# Patient Record
Sex: Male | Born: 1980 | Race: Black or African American | Hispanic: No | Marital: Single | Smoking: Former smoker
Health system: Southern US, Community
[De-identification: ages and names within clinical notes are randomized; demographics above are authoritative.]

## PROBLEM LIST (undated history)

## (undated) DIAGNOSIS — R011 Cardiac murmur, unspecified: Secondary | ICD-10-CM

## (undated) HISTORY — PX: NO PAST SURGERIES: SHX2092

---

## 2007-06-02 ENCOUNTER — Emergency Department: Payer: Self-pay | Admitting: Emergency Medicine

## 2010-01-08 ENCOUNTER — Emergency Department: Payer: Self-pay | Admitting: Emergency Medicine

## 2010-09-20 ENCOUNTER — Emergency Department: Payer: Self-pay | Admitting: Emergency Medicine
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
## 2012-09-19 ENCOUNTER — Emergency Department: Payer: Self-pay | Admitting: Emergency Medicine

## 2012-09-19 LAB — COMPREHENSIVE METABOLIC PANEL
Alkaline Phosphatase: 83 U/L (ref 50–136)
Anion Gap: 5 — ABNORMAL LOW (ref 7–16)
Bilirubin,Total: 0.8 mg/dL (ref 0.2–1.0)
Chloride: 106 mmol/L (ref 98–107)
Co2: 28 mmol/L (ref 21–32)
EGFR (Non-African Amer.): >60
Glucose: 143 mg/dL — ABNORMAL HIGH (ref 65–99)
SGPT (ALT): 26 U/L (ref 12–78)
Sodium: 139 mmol/L (ref 136–145)
Total Protein: 8.3 g/dL — ABNORMAL HIGH (ref 6.4–8.2)

## 2012-09-19 LAB — URINALYSIS, COMPLETE
Bilirubin,UR: NEGATIVE
Nitrite: NEGATIVE
Ph: 6 (ref 4.5–8.0)
Protein: NEGATIVE
WBC UR: 1 /HPF (ref 0–5)

## 2012-09-19 LAB — CBC
HGB: 14.8 g/dL (ref 13.0–18.0)
MCH: 32 pg (ref 26.0–34.0)
MCHC: 34.1 g/dL (ref 32.0–36.0)
MCV: 94 fL (ref 80–100)
WBC: 6 10*3/uL (ref 3.8–10.6)

## 2015-04-03 ENCOUNTER — Encounter: Payer: Self-pay | Admitting: *Deleted

## 2015-04-03 ENCOUNTER — Emergency Department
Admission: EM | Admit: 2015-04-03 | Discharge: 2015-04-03 | Disposition: A | Discharge status: Home / Self Care | Payer: Self-pay | Attending: Emergency Medicine | Admitting: Emergency Medicine

## 2015-04-03 DIAGNOSIS — K047 Periapical abscess without sinus: Secondary | ICD-10-CM | POA: Insufficient documentation

## 2015-04-03 DIAGNOSIS — F17200 Nicotine dependence, unspecified, uncomplicated: Secondary | ICD-10-CM | POA: Insufficient documentation

## 2015-04-03 HISTORY — DX: Cardiac murmur, unspecified: R01.1

## 2015-04-03 MED ORDER — NAPROXEN 500 MG PO TABS: 500.0000 mg | ORAL_TABLET | Freq: Two times a day (BID) | ORAL | Status: AC

## 2015-04-03 MED ORDER — AMOXICILLIN 500 MG PO TABS: 500.0000 mg | ORAL_TABLET | Freq: Three times a day (TID) | ORAL | Status: DC

## 2015-04-03 NOTE — Discharge Instructions (Signed)

## 2015-04-03 NOTE — ED Notes (Signed)
Pt states right sided dental pain and swelling, denies any broken tooth or problems on that side

## 2015-04-03 NOTE — ED Provider Notes (Signed)
[facility] Emergency Department Provider Note ____________________________________________  Time seen: Approximately 3:53 PM  I have reviewed the triage vital signs and the nursing notes.   HISTORY  Chief Complaint Dental Pain   HPI Jared Phillips is a 35 y.o. male who presents to the emergency department for evaluation of dental pain. He states that he noticed some swelling on the right lower jaw last week, but it went away. Pain and swelling started again today. No relief with orajel.    Past Medical History  Diagnosis Date  . Heart murmur     There are no active problems to display for this patient.   History reviewed. No pertinent past surgical history.  Current Outpatient Rx  Name  Route  Sig  Dispense  Refill  . amoxicillin (AMOXIL) 500 MG tablet   Oral   Take 1 tablet (500 mg total) by mouth 3 (three) times daily.   30 tablet   0   . naproxen (NAPROSYN) 500 MG tablet   Oral   Take 1 tablet (500 mg total) by mouth 2 (two) times daily with a meal.   60 tablet   0     Allergies Review of patient's allergies indicates no known allergies.  History reviewed. No pertinent family history.  Social History Social History  Substance Use Topics  . Smoking status: Current Every Day Smoker  . Smokeless tobacco: None  . Alcohol Use: Yes    Review of Systems Constitutional: No fever/chills Eyes: No visual changes. ENT: No sore throat. Cardiovascular: Denies chest pain. Respiratory: Denies shortness of breath. Gastrointestinal: No abdominal pain.  No nausea, no vomiting.  Genitourinary: Negative for dysuria. Musculoskeletal: Negative for back pain. Skin: Negative for rash. Neurological: Negative for headaches, focal weakness or numbness. 10-point ROS otherwise negative.  ____________________________________________   PHYSICAL EXAM:  VITAL SIGNS: ED Triage Vitals  Enc Vitals Group     BP 04/03/15 1530 125/83 mmHg     Pulse  Rate 04/03/15 1530 68     Resp 04/03/15 1530 18     Temp 04/03/15 1530 98.7 F (37.1 C)     Temp Source 04/03/15 1530 Oral     SpO2 04/03/15 1530 97 %     Weight 04/03/15 1530 130 lb (58.968 kg)     Height 04/03/15 1530 5\' 3"  (1.6 m)     Head Cir --      Peak Flow --      Pain Score 04/03/15 1531 8     Pain Loc --      Pain Edu? --      Excl. in GC? --     Constitutional: Alert and oriented. Well appearing and in no acute distress. Eyes: Conjunctivae are normal. PERRL. EOMI. Head: Atraumatic. Nose: No congestion/rhinnorhea. Mouth/Throat: Mucous membranes are moist.  Oropharynx non-erythematous. Periodontal Exam    Neck: No stridor.  Hematological/Lymphatic/Immunilogical: No cervical lymphadenopathy. Cardiovascular:   Good peripheral circulation. Respiratory: Normal respiratory effort.  No retractions. Musculoskeletal: No lower extremity tenderness nor edema.  No joint effusions. Neurologic:  Normal speech and language. No gross focal neurologic deficits are appreciated. Speech is normal. No gait instability. Skin:  Skin is warm, dry and intact. No rash noted. Psychiatric: Mood and affect are normal. Speech and behavior are normal.  ____________________________________________   LABS (all labs ordered are listed, but only abnormal results are displayed)  Labs Reviewed - No data to display ____________________________________________   RADIOLOGY   ____________________________________________   PROCEDURES  Procedure(s) performed: None  Critical Care performed: No  ____________________________________________   INITIAL IMPRESSION / ASSESSMENT AND PLAN / ED COURSE  Pertinent labs & imaging results that were available during my care of the patient were reviewed by me and considered in my medical decision making (see chart for details).  Patient was advised to see the dentist within 14 days. Also advised to take the antibiotic until finished. Instructed to  return to the ER for symptoms that change or worsen if you are unable to schedule an appointment. ____________________________________________   FINAL CLINICAL IMPRESSION(S) / ED DIAGNOSES  Final diagnoses:  Dental abscess       Chinita Pester, FNP 04/03/15 1617  Minna Antis, MD 04/03/15 2235

## 2018-09-17 ENCOUNTER — Other Ambulatory Visit (HOSPITAL_COMMUNITY): Payer: Self-pay | Admitting: Family Medicine

## 2018-09-22 LAB — GONOCOCCUS CULTURE

## 2018-10-08 ENCOUNTER — Other Ambulatory Visit: Payer: Self-pay

## 2018-10-08 ENCOUNTER — Encounter: Payer: Self-pay | Admitting: Physician Assistant

## 2018-10-08 ENCOUNTER — Ambulatory Visit: Payer: Self-pay | Admitting: Physician Assistant

## 2018-10-08 ENCOUNTER — Encounter: Payer: Self-pay | Admitting: Nurse Practitioner

## 2018-10-08 DIAGNOSIS — Z113 Encounter for screening for infections with a predominantly sexual mode of transmission: Secondary | ICD-10-CM

## 2018-10-08 LAB — GRAM STAIN

## 2018-10-08 NOTE — Progress Notes (Signed)
    STI clinic/screening visit  Subjective:  Jared Phillips is a 38 y.o. male being seen today for an STI screening visit. The patient reports they do not have symptoms.  Patient has the following medical conditionsdoes not have a problem list on file.  Chief Complaint  Patient presents with  . SEXUALLY TRANSMITTED DISEASE    Patient reports that he would like a recheck.  Reports that he had some vomiting the day after he was here and treated.  Denies any current s/s, change in any history since his visit on 09/17/2018.  The only change in history is that he had a couple of shots 3 days ago. HPI   See flowsheet for further details and programmatic requirements.    The following portions of the patient's history were reviewed and updated as appropriate: allergies, current medications, past family history, past medical history, past social history, past surgical history and problem list. Problem list updated.  Objective:  There were no vitals filed for this visit.  Physical Exam Constitutional:      General: He is not in acute distress.    Appearance: Normal appearance.  HENT:     Head: Normocephalic and atraumatic.     Mouth/Throat:     Mouth: Mucous membranes are moist.     Pharynx: Oropharynx is clear. No oropharyngeal exudate or posterior oropharyngeal erythema.  Neck:     Musculoskeletal: Neck supple.  Pulmonary:     Effort: Pulmonary effort is normal.  Abdominal:     Palpations: Abdomen is soft. There is no mass.     Tenderness: There is no abdominal tenderness. There is no guarding or rebound.     Hernia: No hernia is present.  Genitourinary:    Penis: Normal.      Scrotum/Testes: Normal.     Comments: Pubic area without edema, erythema, nits, lice or lesions Penis without edema, erythema, lesions or urethral discharge No inguinal adenopathy  Lymphadenopathy:     Cervical: No cervical adenopathy.  Skin:    General: Skin is warm and dry.     Findings: No  bruising, erythema, lesion or rash.  Neurological:     Mental Status: He is alert and oriented to person, place, and time.  Psychiatric:        Mood and Affect: Mood normal.        Behavior: Behavior normal.       Assessment and Plan:  Jared Phillips is a 38 y.o. male presenting to the [facility] Department for STI screening  1. Screening for STD (sexually transmitted disease) Patient into clinic for rescreenig to make sure that the NGU seen on slide at last visit has cleared Rec condoms with all sex RTC prn - Gonococcus culture - Gram stain     No follow-ups on file.  No future appointments.  Jerene Dilling, PA

## 2018-10-13 LAB — GONOCOCCUS CULTURE

## 2019-03-05 ENCOUNTER — Ambulatory Visit: Payer: Self-pay | Admitting: Physician Assistant

## 2019-03-05 ENCOUNTER — Other Ambulatory Visit: Payer: Self-pay

## 2019-03-05 DIAGNOSIS — Z113 Encounter for screening for infections with a predominantly sexual mode of transmission: Secondary | ICD-10-CM

## 2019-03-05 LAB — GRAM STAIN

## 2019-03-05 NOTE — Progress Notes (Signed)
Gram Stain results reviewed. Per standing orders no treatment indicated. Condoms given. Hal Morales, RN

## 2019-03-06 ENCOUNTER — Encounter: Payer: Self-pay | Admitting: Physician Assistant

## 2019-03-06 NOTE — Progress Notes (Signed)
    STI clinic/screening visit  Subjective:  Jared Phillips is a 38 y.o. male being seen today for an STI screening visit. The patient reports they do not have symptoms.   Patient has the following medical conditions:  There are no active problems to display for this patient.    Chief Complaint  Patient presents with  . SEXUALLY TRANSMITTED DISEASE    HPI  Patient reports that he is not having any symptoms but would like a screening.  Declines blood work today.  Denies chronic conditions and history of surgery.  See flowsheet for further details and programmatic requirements.    The following portions of the patient's history were reviewed and updated as appropriate: allergies, current medications, past medical history, past social history, past surgical history and problem list.  Objective:  There were no vitals filed for this visit.  Physical Exam Constitutional:      General: He is not in acute distress.    Appearance: Normal appearance. He is normal weight.  HENT:     Head: Normocephalic and atraumatic.     Comments: No nits, lice, or hair loss. No cervical, supraclavicular or axillary adenopathy.    Mouth/Throat:     Mouth: Mucous membranes are moist.     Pharynx: Oropharynx is clear. No oropharyngeal exudate or posterior oropharyngeal erythema.  Eyes:     Conjunctiva/sclera: Conjunctivae normal.  Neck:     Musculoskeletal: Neck supple. No muscular tenderness.  Genitourinary:    Penis: Normal.      Scrotum/Testes: Normal.     Comments: Pubic area without nits, lice, edema, erythema, lesions and inguinal adenopathy. Penis circumcised and without discharge at meatus. Skin:    General: Skin is warm and dry.     Findings: No bruising, erythema, lesion or rash.  Neurological:     Mental Status: He is alert and oriented to person, place, and time.  Psychiatric:        Mood and Affect: Mood normal.        Behavior: Behavior normal.        Thought Content:  Thought content normal.        Judgment: Judgment normal.       Assessment and Plan:  Jared Phillips is a 38 y.o. male presenting to the [facility] Department for STI screening  1. Screening for STD (sexually transmitted disease) Patient into clinic without symptoms.  Patient declines blood work today. Rec condoms with all sex. Await test results.  Counseled that RN will call if needs to RTC for treatment once results are back. - Gram stain - Gonococcus culture     No follow-ups on file.  No future appointments.  Jerene Dilling, PA

## 2019-03-10 LAB — GONOCOCCUS CULTURE

## 2019-04-24 ENCOUNTER — Ambulatory Visit: Payer: Self-pay

## 2019-05-02 ENCOUNTER — Ambulatory Visit: Payer: Self-pay | Admitting: Physician Assistant

## 2019-05-02 ENCOUNTER — Encounter: Payer: Self-pay | Admitting: Physician Assistant

## 2019-05-02 ENCOUNTER — Other Ambulatory Visit: Payer: Self-pay

## 2019-05-02 DIAGNOSIS — Z113 Encounter for screening for infections with a predominantly sexual mode of transmission: Secondary | ICD-10-CM

## 2019-05-02 LAB — GRAM STAIN

## 2019-05-02 NOTE — Progress Notes (Signed)
   [facility] Department STI clinic/screening visit  Subjective:  Jared Phillips is a 39 y.o. male being seen today for an STI screening visit. The patient reports they do not have symptoms.    Patient has the following medical conditions:  There are no problems to display for this patient.    Chief Complaint  Patient presents with  . SEXUALLY TRANSMITTED DISEASE    HPI  Patient reports that he does not have any symptoms but would like a screening today.  Declines blood work today.   See flowsheet for further details and programmatic requirements.    The following portions of the patient's history were reviewed and updated as appropriate: allergies, current medications, past medical history, past social history, past surgical history and problem list.  Objective:  There were no vitals filed for this visit.  Physical Exam Constitutional:      General: He is not in acute distress.    Appearance: Normal appearance. He is normal weight.  HENT:     Head: Normocephalic and atraumatic.     Comments: No nits, lice, or hair loss. No cervical, supraclavicular or axillary adenopathy.    Mouth/Throat:     Mouth: Mucous membranes are moist.     Pharynx: Oropharynx is clear. No oropharyngeal exudate or posterior oropharyngeal erythema.  Eyes:     Conjunctiva/sclera: Conjunctivae normal.  Pulmonary:     Effort: Pulmonary effort is normal.  Abdominal:     Palpations: Abdomen is soft. There is no mass.     Tenderness: There is no abdominal tenderness. There is no guarding or rebound.  Genitourinary:    Penis: Normal.      Comments: Pubic area without nits, lice, edema, erythema, lesions and inguinal adenopathy. Penis circumcised and without lesions, rash and discharge at meatus. Musculoskeletal:     Cervical back: Neck supple. No tenderness.  Skin:    General: Skin is warm and dry.     Findings: No bruising, erythema, lesion or rash.  Neurological:     Mental  Status: He is alert and oriented to person, place, and time.  Psychiatric:        Mood and Affect: Mood normal.        Behavior: Behavior normal.        Thought Content: Thought content normal.        Judgment: Judgment normal.       Assessment and Plan:  Jared Phillips is a 39 y.o. male presenting to the [facility] Department for STI screening  1. Screening for STD (sexually transmitted disease) Patient into clinic without symptoms.  Patient declines blood work today. Rec condoms with all sex. Await test results.  Counseled that RN will call if needs to RTC for treatment once results are back. - Gram stain - Gonococcus culture     No follow-ups on file.  No future appointments.  Matt Holmes, PA

## 2019-05-06 LAB — GONOCOCCUS CULTURE

## 2019-05-28 ENCOUNTER — Ambulatory Visit: Payer: Self-pay

## 2019-05-30 ENCOUNTER — Other Ambulatory Visit: Payer: Self-pay

## 2019-05-30 ENCOUNTER — Ambulatory Visit
Admission: EM | Admit: 2019-05-30 | Discharge: 2019-05-30 | Disposition: A | Discharge status: Home / Self Care | Payer: Self-pay | Attending: Emergency Medicine | Admitting: Emergency Medicine

## 2019-05-30 DIAGNOSIS — Z202 Contact with and (suspected) exposure to infections with a predominantly sexual mode of transmission: Secondary | ICD-10-CM | POA: Insufficient documentation

## 2019-05-30 NOTE — ED Triage Notes (Signed)
Patient states that he is here for STD testing, no known exposure.

## 2019-05-30 NOTE — Discharge Instructions (Addendum)
Your STD tests are pending.  If your test results are positive, we will call you.  You may need treatment and your partner(s) may also need treatment.    Not have sexual activity until your test results are back.

## 2019-05-30 NOTE — ED Provider Notes (Signed)
[name]    CSN: [id number] Arrival date & time: 05/30/19  1146      History   Chief Complaint Chief Complaint  Patient presents with  . STD Testing    HPI Jared Phillips is a 39 y.o. male.   Patient presents with request for STD testing.  He denies symptoms.  He denies lesions, rash, penile discharge, testicular pain, dysuria, abdominal pain, or other symptoms.  Dates he has a history of gonorrhea.  The history is provided by the patient.    Past Medical History:  Diagnosis Date  . Heart murmur     There are no problems to display for this patient.   Past Surgical History:  Procedure Laterality Date  . NO PAST SURGERIES         Home Medications    Prior to Admission medications   Not on File    Family History Family History  Problem Relation Age of Onset  . Healthy Father     Social History Social History   Tobacco Use  . Smoking status: Current Every Day Smoker    Types: Cigarettes  . Smokeless tobacco: Never Used  Substance Use Topics  . Alcohol use: Yes    Comment: occasinally has 1-3 shots of liquor  . Drug use: Not Currently     Allergies   Patient has no known allergies.   Review of Systems Review of Systems  Constitutional: Negative for chills and fever.  HENT: Negative for ear pain and sore throat.   Eyes: Negative for pain and visual disturbance.  Respiratory: Negative for cough and shortness of breath.   Cardiovascular: Negative for chest pain and palpitations.  Gastrointestinal: Negative for abdominal pain and vomiting.  Genitourinary: Negative for discharge, dysuria, hematuria and testicular pain.  Musculoskeletal: Negative for arthralgias and back pain.  Skin: Negative for color change, rash and wound.  Neurological: Negative for seizures and syncope.  All other systems reviewed and are negative.    Physical Exam Triage Vital Signs ED Triage Vitals  Enc Vitals Group     BP 05/30/19 1154 118/72   Pulse Rate 05/30/19 1154 67     Resp 05/30/19 1154 16     Temp 05/30/19 1154 98.4 F (36.9 C)     Temp Source 05/30/19 1154 Oral     SpO2 05/30/19 1154 98 %     Weight 05/30/19 1152 120 lb (54.4 kg)     Height 05/30/19 1152 5\' 3"  (1.6 m)     Head Circumference --      Peak Flow --      Pain Score 05/30/19 1152 0     Pain Loc --      Pain Edu? --      Excl. in GC? --    No data found.  Updated Vital Signs BP 118/72 (BP Location: Right Arm)   Pulse 67   Temp 98.4 F (36.9 C) (Oral)   Resp 16   Ht 5\' 3"  (1.6 m)   Wt 120 lb (54.4 kg)   SpO2 98%   BMI 21.26 kg/m   Visual Acuity Right Eye Distance:   Left Eye Distance:   Bilateral Distance:    Right Eye Near:   Left Eye Near:    Bilateral Near:     Physical Exam Vitals and nursing note reviewed.  Constitutional:      General: He is not in acute distress.    Appearance: He is well-developed.  HENT:  Head: Normocephalic and atraumatic.     Mouth/Throat:     Mouth: Mucous membranes are moist.     Pharynx: Oropharynx is clear.  Eyes:     Conjunctiva/sclera: Conjunctivae normal.  Cardiovascular:     Rate and Rhythm: Normal rate and regular rhythm.     Heart sounds: No murmur.  Pulmonary:     Effort: Pulmonary effort is normal. No respiratory distress.     Breath sounds: Normal breath sounds.  Abdominal:     General: Bowel sounds are normal.     Palpations: Abdomen is soft.     Tenderness: There is no abdominal tenderness. There is no right CVA tenderness, left CVA tenderness, guarding or rebound.  Genitourinary:    Penis: Normal.      Testes: Normal.  Musculoskeletal:     Cervical back: Neck supple.  Skin:    General: Skin is warm and dry.     Findings: No rash.  Neurological:     General: No focal deficit present.     Mental Status: He is alert and oriented to person, place, and time.  Psychiatric:        Mood and Affect: Mood normal.        Behavior: Behavior normal.      UC Treatments / Results   Labs (all labs ordered are listed, but only abnormal results are displayed) Labs Reviewed  CYTOLOGY, (ORAL, ANAL, URETHRAL) ANCILLARY ONLY    EKG   Radiology No results found.  Procedures Procedures (including critical care time)  Medications Ordered in UC Medications - No data to display  Initial Impression / Assessment and Plan / UC Course  I have reviewed the triage vital signs and the nursing notes.  Pertinent labs & imaging results that were available during my care of the patient were reviewed by me and considered in my medical decision making (see chart for details).    Possible exposure to STD.  Urethral swab obtained.  Instructed patient to abstain from sexual activity until his test results are back.  Discussed that he and his sexual partners may require treatment if his test results are positive.  Patient agrees to plan of care.     Final Clinical Impressions(s) / UC Diagnoses   Final diagnoses:  Possible exposure to STD     Discharge Instructions     Your STD tests are pending.  If your test results are positive, we will call you.  You may need treatment and your partner(s) may also need treatment.    Not have sexual activity until your test results are back.     ED Prescriptions    None     PDMP not reviewed this encounter.   Sharion Balloon, NP 05/30/19 [id number]

## 2019-05-31 LAB — CYTOLOGY, (ORAL, ANAL, URETHRAL) ANCILLARY ONLY
Chlamydia: NEGATIVE
Neisseria Gonorrhea: NEGATIVE
Trichomonas: NEGATIVE

## 2019-06-04 ENCOUNTER — Ambulatory Visit: Payer: Self-pay

## 2019-06-28 ENCOUNTER — Emergency Department: Payer: Self-pay

## 2019-06-28 ENCOUNTER — Emergency Department
Admission: EM | Admit: 2019-06-28 | Discharge: 2019-06-28 | Disposition: A | Discharge status: Home / Self Care | Payer: Self-pay | Attending: Emergency Medicine | Admitting: Emergency Medicine

## 2019-06-28 ENCOUNTER — Encounter: Payer: Self-pay | Admitting: Emergency Medicine

## 2019-06-28 ENCOUNTER — Other Ambulatory Visit: Payer: Self-pay

## 2019-06-28 DIAGNOSIS — W500XXA Accidental hit or strike by another person, initial encounter: Secondary | ICD-10-CM | POA: Insufficient documentation

## 2019-06-28 DIAGNOSIS — Y999 Unspecified external cause status: Secondary | ICD-10-CM | POA: Insufficient documentation

## 2019-06-28 DIAGNOSIS — F17210 Nicotine dependence, cigarettes, uncomplicated: Secondary | ICD-10-CM | POA: Insufficient documentation

## 2019-06-28 DIAGNOSIS — S4991XA Unspecified injury of right shoulder and upper arm, initial encounter: Secondary | ICD-10-CM

## 2019-06-28 DIAGNOSIS — Y929 Unspecified place or not applicable: Secondary | ICD-10-CM | POA: Insufficient documentation

## 2019-06-28 DIAGNOSIS — S0081XA Abrasion of other part of head, initial encounter: Secondary | ICD-10-CM

## 2019-06-28 DIAGNOSIS — Y939 Activity, unspecified: Secondary | ICD-10-CM | POA: Insufficient documentation

## 2019-06-28 MED ORDER — BACITRACIN-NEOMYCIN-POLYMYXIN 400-5-5000 EX OINT
TOPICAL_OINTMENT | Freq: Once | CUTANEOUS | Status: DC
  Filled 2019-06-28: qty 1

## 2019-06-28 NOTE — ED Provider Notes (Signed)
[facility] Emergency Department Provider Note  ____________________________________________  Time seen: Approximately 10:04 AM  I have reviewed the triage vital signs and the nursing notes.   HISTORY  Chief Complaint Fall    HPI Jared Phillips is a 39 y.o. male that presents to the emergency department for evaluation of right shoulder pain, diffuse back pain, bilateral knee pain after police arrest.  Patient states that the police slammed him to the ground.  He needs medical clearance to go to jail.  He has some abrasions to his face, which is minimally sore.  Most pain is to his shoulder, which he hears popping with movement.  He is walking without difficulty. Areas are "sore."  He does not feel that anything is broken.  He did not lose consciousness.  No shortness of breath, chest pain, abdominal pain.  Past Medical History:  Diagnosis Date  . Heart murmur     There are no problems to display for this patient.   Past Surgical History:  Procedure Laterality Date  . NO PAST SURGERIES      Prior to Admission medications   Not on File    Allergies Patient has no known allergies.  Family History  Problem Relation Age of Onset  . Healthy Father     Social History Social History   Tobacco Use  . Smoking status: Current Every Day Smoker    Types: Cigarettes  . Smokeless tobacco: Never Used  Substance Use Topics  . Alcohol use: Yes    Comment: occasinally has 1-3 shots of liquor  . Drug use: Not on file     Review of Systems  Cardiovascular: No chest pain. Respiratory: No SOB. Gastrointestinal: No abdominal pain.  No nausea, no vomiting.  Musculoskeletal: Positive for shoulder pain.  Negative for neck pain. Skin: Negative for rash, abrasions, lacerations, ecchymosis. Neurological: Negative for headaches   ____________________________________________   PHYSICAL EXAM:  VITAL SIGNS: ED Triage Vitals  Enc Vitals Group     BP  06/28/19 0901 131/80     Pulse Rate 06/28/19 0901 74     Resp 06/28/19 0901 16     Temp 06/28/19 0901 98.2 F (36.8 C)     Temp Source 06/28/19 0901 Oral     SpO2 06/28/19 0901 100 %     Weight 06/28/19 0856 115 lb (52.2 kg)     Height 06/28/19 0856 5\' 3"  (1.6 m)     Head Circumference --      Peak Flow --      Pain Score 06/28/19 0856 6     Pain Loc --      Pain Edu? --      Excl. in GC? --      Constitutional: Alert and oriented. Well appearing and in no acute distress. Eyes: Conjunctivae are normal. PERRL. EOMI. Head: Abrasions to bilateral cheeks without surrounding tenderness to palpation. ENT:      Ears:      Nose: No congestion/rhinnorhea.      Mouth/Throat: Mucous membranes are moist.  Neck: No stridor.  No cervical spine tenderness to palpation. Cardiovascular: Normal rate, regular rhythm.  Good peripheral circulation. Respiratory: Normal respiratory effort without tachypnea or retractions. Lungs CTAB. Good air entry to the bases with no decreased or absent breath sounds. Gastrointestinal: Soft and nontender to palpation. No guarding or rigidity. No palpable masses. No distention.  Musculoskeletal: Full range of motion to all extremities. No gross deformities appreciated.  Full range of motion  of right shoulder.  Tenderness to palpation to anterior and posterior shoulder.  Strength equal in upper extremities bilaterally.  Ambulatory without assistance.  Normal gait. Neurologic:  Normal speech and language. No gross focal neurologic deficits are appreciated.  Skin:  Skin is warm, dry and intact. No rash noted. Psychiatric: Mood and affect are normal. Speech and behavior are normal. Patient exhibits appropriate insight and judgement.   ____________________________________________   LABS (all labs ordered are listed, but only abnormal results are displayed)  Labs Reviewed - No data to  display ____________________________________________  EKG   ____________________________________________  RADIOLOGY Robinette Haines, personally viewed and evaluated these images (plain radiographs) as part of my medical decision making, as well as reviewing the written report by the radiologist.  DG Shoulder Right  Result Date: 06/28/2019 CLINICAL DATA:  Posttraumatic right shoulder pain EXAM: RIGHT SHOULDER - 2+ VIEW COMPARISON:  None. FINDINGS: There is no evidence of fracture or dislocation. There is no evidence of arthropathy or other focal bone abnormality. Soft tissues are unremarkable. IMPRESSION: Negative. Electronically Signed   By: Monte Fantasia M.D.   On: 06/28/2019 10:31    ____________________________________________    PROCEDURES  Procedure(s) performed:    Procedures    Medications  neomycin-bacitracin-polymyxin (NEOSPORIN) ointment packet (has no administration in time range)     ____________________________________________   INITIAL IMPRESSION / ASSESSMENT AND PLAN / ED COURSE  Pertinent labs & imaging results that were available during my care of the patient were reviewed by me and considered in my medical decision making (see chart for details).  Review of the [place] CSRS was performed in accordance of the [place] prior to dispensing any controlled drugs.   Patient presented to the emergency department for evaluation after injury this morning.  Shoulder x-ray is negative for acute bony abnormalities.  Patient is to follow up with primary care as directed. Patient is given ED precautions to return to the ED for any worsening or new symptoms.   Jared Phillips Phillips was evaluated in Emergency Department on 06/28/2019 for the symptoms described in the history of present illness. He was evaluated in the context of the global COVID-19 pandemic, which necessitated consideration that the patient might be at risk for infection with the SARS-CoV-2 virus that causes  COVID-19. Institutional protocols and algorithms that pertain to the evaluation of patients at risk for COVID-19 are in a state of rapid change based on information released by regulatory bodies including the CDC and federal and state organizations. These policies and algorithms were followed during the patient's care in the ED.  ____________________________________________  FINAL CLINICAL IMPRESSION(S) / ED DIAGNOSES  Final diagnoses:  Injury of right shoulder, initial encounter  Abrasion of face, initial encounter      NEW MEDICATIONS STARTED DURING THIS VISIT:  ED Discharge Orders    None          This chart was dictated using voice recognition software/Dragon. Despite best efforts to proofread, errors can occur which can change the meaning. Any change was purely unintentional.    Laban Emperor, PA-C 06/28/19 1152    Lavonia Drafts, MD 06/28/19 1314

## 2019-06-28 NOTE — ED Triage Notes (Addendum)
Pt here for right shoulder pain, back pain, and bilateral knee pain.  Pt states "I didn't willingly fall, they slammed me on the ground".  Ambulatory without difficulty to check in.  Here to be medically cleared so can go to jail.  Also c/o pain to face.

## 2019-06-28 NOTE — ED Notes (Signed)
See triage note   Presents with police  States he is having right shoulder and bilateral knee pain  Also having some lower back pain  Ambulates well to treatment room

## 2019-08-12 ENCOUNTER — Other Ambulatory Visit: Payer: Self-pay

## 2019-08-12 ENCOUNTER — Ambulatory Visit: Payer: Self-pay | Admitting: Physician Assistant

## 2019-08-12 DIAGNOSIS — Z113 Encounter for screening for infections with a predominantly sexual mode of transmission: Secondary | ICD-10-CM

## 2019-08-12 LAB — GRAM STAIN

## 2019-08-12 NOTE — Progress Notes (Signed)
Gram stain reviewed, no tx per provider orders. Provider orders completed. 

## 2019-08-13 ENCOUNTER — Encounter: Payer: Self-pay | Admitting: Physician Assistant

## 2019-08-13 NOTE — Progress Notes (Signed)
   [facility] Department STI clinic/screening visit  Subjective:  Jared Phillips is a 39 y.o. male being seen today for an STI screening visit. The patient reports they do not have symptoms.    Patient has the following medical conditions:  There are no problems to display for this patient.    Chief Complaint  Patient presents with  . SEXUALLY TRANSMITTED DISEASE    STD screening except bloodwork    HPI  Patient reports that he does not have any symptoms but would like a screening today.  Denies chronic conditions, regular medications and history of surgery.  Declines blood work today.    See flowsheet for further details and programmatic requirements.    The following portions of the patient's history were reviewed and updated as appropriate: allergies, current medications, past medical history, past social history, past surgical history and problem list.  Objective:  There were no vitals filed for this visit.  Physical Exam Constitutional:      General: He is not in acute distress.    Appearance: Normal appearance.  HENT:     Head: Normocephalic and atraumatic.     Comments: No nits, lice, or hair loss. No cervical, supraclavicular or axillary adenopathy.    Mouth/Throat:     Mouth: Mucous membranes are moist.     Pharynx: Oropharynx is clear. No oropharyngeal exudate or posterior oropharyngeal erythema.  Eyes:     Conjunctiva/sclera: Conjunctivae normal.  Pulmonary:     Effort: Pulmonary effort is normal.  Abdominal:     Palpations: Abdomen is soft. There is no mass.     Tenderness: There is no abdominal tenderness. There is no guarding or rebound.  Genitourinary:    Penis: Normal.      Testes: Normal.     Comments: Pubic area without nits, lice, edema, erythema, lesions and inguinal adenopathy. Penis circumcised, without rash, lesions and discharge at meatus. Musculoskeletal:     Cervical back: Neck supple. No tenderness.  Skin:    General:  Skin is warm and dry.     Findings: No bruising, erythema, lesion or rash.  Neurological:     Mental Status: He is alert and oriented to person, place, and time.  Psychiatric:        Mood and Affect: Mood normal.        Behavior: Behavior normal.        Thought Content: Thought content normal.        Judgment: Judgment normal.       Assessment and Plan:  Jared Phillips is a 39 y.o. male presenting to the [facility] Department for STI screening  1. Screening for STD (sexually transmitted disease) Patient into clinic without symptoms.  Declines blood work today.  Rec condoms with all sex. Await test results.  Counseled that RN will call if needs to RTC for treatment once results are back. - Gram stain - Gonococcus culture - Gonococcus culture     No follow-ups on file.  No future appointments.  Matt Holmes, PA

## 2019-08-16 LAB — GONOCOCCUS CULTURE

## 2019-10-17 ENCOUNTER — Other Ambulatory Visit: Payer: Self-pay

## 2019-10-17 ENCOUNTER — Ambulatory Visit: Payer: Self-pay | Admitting: Physician Assistant

## 2019-10-17 DIAGNOSIS — Z113 Encounter for screening for infections with a predominantly sexual mode of transmission: Secondary | ICD-10-CM

## 2019-10-17 LAB — GRAM STAIN

## 2019-10-17 NOTE — Progress Notes (Signed)
Gram stain reviewed and is negative today, so no treatment needed for gram stain per standing order. Provider orders completed. 

## 2019-10-18 ENCOUNTER — Encounter: Payer: Self-pay | Admitting: Physician Assistant

## 2019-10-18 NOTE — Progress Notes (Signed)
   [facility] Department STI clinic/screening visit  Subjective:  Jared Phillips is a 39 y.o. male being seen today for an STI screening visit. The patient reports they do not have symptoms.    Patient has the following medical conditions:  There are no problems to display for this patient.    Chief Complaint  Patient presents with  . SEXUALLY TRANSMITTED DISEASE    screening    HPI  Patient reports that he is not having any symptoms but would like a screening today.  Denies chronic conditions, surgeries and regular medicines.  States last HIV testing was 3 months ago.    See flowsheet for further details and programmatic requirements.    The following portions of the patient's history were reviewed and updated as appropriate: allergies, current medications, past medical history, past social history, past surgical history and problem list.  Objective:  There were no vitals filed for this visit.  Physical Exam Constitutional:      General: He is not in acute distress.    Appearance: Normal appearance.  HENT:     Head: Normocephalic and atraumatic.     Comments: No nits, lice or hair loss. No cervical, supraclavicular or axillary adenopathy.    Mouth/Throat:     Mouth: Mucous membranes are moist.     Pharynx: Oropharynx is clear. No oropharyngeal exudate or posterior oropharyngeal erythema.  Eyes:     Conjunctiva/sclera: Conjunctivae normal.  Pulmonary:     Effort: Pulmonary effort is normal.  Abdominal:     Palpations: Abdomen is soft. There is no mass.     Tenderness: There is no abdominal tenderness. There is no guarding or rebound.  Genitourinary:    Penis: Normal.      Testes: Normal.     Comments: Pubic area without nits, lice, edema, erythema, lesions and inguinal adenopathy. Penis circumcised, without rash, lesions and discharge from meatus. Musculoskeletal:     Cervical back: Neck supple. No tenderness.  Skin:    General: Skin is warm and  dry.     Findings: No bruising, erythema, lesion or rash.  Neurological:     Mental Status: He is alert and oriented to person, place, and time.  Psychiatric:        Mood and Affect: Mood normal.        Behavior: Behavior normal.        Thought Content: Thought content normal.        Judgment: Judgment normal.       Assessment and Plan:  Jared Phillips is a 39 y.o. male presenting to the [facility] Department for STI screening  1. Screening for STD (sexually transmitted disease) Patient into clinic without symptoms. Declines blood work today.  Rec condoms with all sex. Await test results.  Counseled that RN will call if needs to RTC for treatment once results are back. - Gram stain - Gonococcus culture     Return for 2-3 weeks for TR's, and PRN.  No future appointments.  Matt Holmes, PA

## 2019-10-22 LAB — GONOCOCCUS CULTURE

## 2020-01-06 ENCOUNTER — Other Ambulatory Visit: Payer: Self-pay

## 2020-01-06 ENCOUNTER — Ambulatory Visit: Payer: Self-pay | Admitting: Physician Assistant

## 2020-01-06 DIAGNOSIS — Z113 Encounter for screening for infections with a predominantly sexual mode of transmission: Secondary | ICD-10-CM

## 2020-01-06 LAB — GRAM STAIN

## 2020-01-06 NOTE — Progress Notes (Signed)
Gram Stain results reviewed. Per standing orders no treatment indicated. Tawny Hopping, RN

## 2020-01-07 ENCOUNTER — Encounter: Payer: Self-pay | Admitting: Physician Assistant

## 2020-01-07 NOTE — Progress Notes (Signed)
   [facility] Department STI clinic/screening visit  Subjective:  Jared Phillips is a 39 y.o. male being seen today for an STI screening visit. The patient reports they do not have symptoms.    Patient has the following medical conditions:  There are no problems to display for this patient.    Chief Complaint  Patient presents with  . SEXUALLY TRANSMITTED DISEASE    screening    HPI  Patient reports that he is not having symptoms but a partner of his has told him that she has BV and wants him to be checked.  Denies chronic conditions, surgeries and regular medicines.  States last HIV test was 2 months ago.   See flowsheet for further details and programmatic requirements.    The following portions of the patient's history were reviewed and updated as appropriate: allergies, current medications, past medical history, past social history, past surgical history and problem list.  Objective:  There were no vitals filed for this visit.  Physical Exam Constitutional:      General: He is not in acute distress.    Appearance: Normal appearance.  HENT:     Head: Normocephalic and atraumatic.     Comments: No nits,lice, or hair loss. No cervical, supraclavicular or axillary adenopathy.    Mouth/Throat:     Mouth: Mucous membranes are moist.     Pharynx: Oropharynx is clear. No oropharyngeal exudate or posterior oropharyngeal erythema.  Eyes:     Conjunctiva/sclera: Conjunctivae normal.  Pulmonary:     Effort: Pulmonary effort is normal.  Abdominal:     Palpations: Abdomen is soft. There is no mass.     Tenderness: There is no abdominal tenderness. There is no guarding or rebound.  Genitourinary:    Penis: Normal.      Testes: Normal.     Comments: Pubic area without nits, lice, hair loss, edema, erythema, lesions and inguinal adenopathy. Penis circumcised without rash, lesions and discharge at meatus. Musculoskeletal:     Cervical back: Neck supple. No  tenderness.  Skin:    General: Skin is warm and dry.     Findings: No bruising, erythema, lesion or rash.  Neurological:     Mental Status: He is alert and oriented to person, place, and time.  Psychiatric:        Mood and Affect: Mood normal.        Behavior: Behavior normal.        Thought Content: Thought content normal.        Judgment: Judgment normal.       Assessment and Plan:  Jared Phillips is a 39 y.o. male presenting to the [facility] Department for STI screening  1. Screening for STD (sexually transmitted disease) Patient into clinic without symptoms. Patient declines blood work today. Counseled patient re: BV and that it is not something that he can have. Counseled patient that if he finds out that his partner tests positive for anything else, to call for a treatment appointment. Rec condoms with all sex. Await test results.  Counseled that RN will call if needs to RTC for treatment once results are back. - Gram stain - Gonococcus culture     No follow-ups on file.  No future appointments.  Matt Holmes, PA

## 2020-01-11 LAB — GONOCOCCUS CULTURE

## 2020-04-15 ENCOUNTER — Encounter: Payer: Self-pay | Admitting: Physician Assistant

## 2020-04-15 ENCOUNTER — Ambulatory Visit: Payer: Self-pay | Admitting: Physician Assistant

## 2020-04-15 ENCOUNTER — Other Ambulatory Visit: Payer: Self-pay

## 2020-04-15 DIAGNOSIS — Z113 Encounter for screening for infections with a predominantly sexual mode of transmission: Secondary | ICD-10-CM

## 2020-04-15 LAB — GRAM STAIN

## 2020-04-15 NOTE — Progress Notes (Signed)
   [facility] Department STI clinic/screening visit  Subjective:  Jared Phillips is a 40 y.o. male being seen today for an STI screening visit. The patient reports they do not have symptoms.    Patient has the following medical conditions:  There are no problems to display for this patient.    Chief Complaint  Patient presents with  . SEXUALLY TRANSMITTED DISEASE    screening    HPI  Patient reports that he is not having any symptoms but would like a screening today.  Denies chronic conditions, surgeries and regular medicines.  Reports that his last HIV test was in 2021 and last void prior to sample collection for Gram stain was 2 hr ago.   See flowsheet for further details and programmatic requirements.    The following portions of the patient's history were reviewed and updated as appropriate: allergies, current medications, past medical history, past social history, past surgical history and problem list.  Objective:  There were no vitals filed for this visit.  Physical Exam Constitutional:      General: He is not in acute distress.    Appearance: Normal appearance.  HENT:     Head: Normocephalic and atraumatic.     Comments: No nits,lice, or hair loss. No cervical, supraclavicular or axillary adenopathy.    Mouth/Throat:     Mouth: Mucous membranes are moist.     Pharynx: Oropharynx is clear. No oropharyngeal exudate or posterior oropharyngeal erythema.  Eyes:     Conjunctiva/sclera: Conjunctivae normal.  Pulmonary:     Effort: Pulmonary effort is normal.  Abdominal:     Palpations: Abdomen is soft. There is no mass.     Tenderness: There is no abdominal tenderness. There is no guarding or rebound.  Genitourinary:    Penis: Normal.      Testes: Normal.     Comments: Pubic area without nits, lice, hair loss, edema, erythema, lesions and inguinal adenopathy. Penis circumcised without rash, lesions and discharge at meatus. Musculoskeletal:      Cervical back: Neck supple. No tenderness.  Skin:    General: Skin is warm and dry.     Findings: No bruising, erythema, lesion or rash.  Neurological:     Mental Status: He is alert and oriented to person, place, and time.  Psychiatric:        Mood and Affect: Mood normal.        Behavior: Behavior normal.        Thought Content: Thought content normal.        Judgment: Judgment normal.       Assessment and Plan:  Jared Phillips is a 40 y.o. male presenting to the [facility] Department for STI screening  1. Screening for STD (sexually transmitted disease) Patient into clinic without symptoms. Patient declines blood work today. Reviewed Gram stain results and that no treatment is indicated today. Rec condoms with all sex. Await test results.  Counseled that RN will call if needs to RTC for treatment once results are back. - Gram stain - Gonococcus culture     No follow-ups on file.  No future appointments.  Matt Holmes, PA

## 2020-04-20 LAB — GONOCOCCUS CULTURE

## 2020-08-26 ENCOUNTER — Other Ambulatory Visit: Payer: Self-pay

## 2020-08-26 ENCOUNTER — Encounter: Payer: Self-pay | Admitting: Physician Assistant

## 2020-08-26 ENCOUNTER — Ambulatory Visit: Payer: Self-pay | Admitting: Physician Assistant

## 2020-08-26 DIAGNOSIS — Z113 Encounter for screening for infections with a predominantly sexual mode of transmission: Secondary | ICD-10-CM

## 2020-08-26 LAB — GRAM STAIN

## 2020-08-26 NOTE — Progress Notes (Signed)
Pt here for STD screening.  Gram Stain reviewed.  No treatment required. Berdie Ogren, RN

## 2020-08-26 NOTE — Progress Notes (Signed)
   [facility] Department STI clinic/screening visit  Subjective:  Jared Phillips is a 40 y.o. male being seen today for an STI screening visit. The patient reports they do not have symptoms.    Patient has the following medical conditions:  There are no problems to display for this patient.    Chief Complaint  Patient presents with  . SEXUALLY TRANSMITTED DISEASE    STD screening except bloodwork    HPI  Patient reports that he is not having any symptoms but wants a screening today.  Denies chronic conditions, surgeries and regular medicines.  States last HIV test was 2-3 months ago and last void prior to sample collection for Gram stain was about 45 minutes ago.   See flowsheet for further details and programmatic requirements.    The following portions of the patient's history were reviewed and updated as appropriate: allergies, current medications, past medical history, past social history, past surgical history and problem list.  Objective:  There were no vitals filed for this visit.  Physical Exam Constitutional:      General: He is not in acute distress.    Appearance: Normal appearance.  HENT:     Head: Normocephalic and atraumatic.     Comments: No nits,lice, or hair loss. No cervical, supraclavicular or axillary adenopathy.    Mouth/Throat:     Mouth: Mucous membranes are moist.     Pharynx: Oropharynx is clear. No oropharyngeal exudate or posterior oropharyngeal erythema.  Eyes:     Conjunctiva/sclera: Conjunctivae normal.  Pulmonary:     Effort: Pulmonary effort is normal.  Abdominal:     Palpations: Abdomen is soft. There is no mass.     Tenderness: There is no abdominal tenderness. There is no guarding or rebound.  Genitourinary:    Penis: Normal.      Testes: Normal.     Comments: Pubic area without nits, lice, hair loss, edema, erythema, lesions and inguinal adenopathy. Penis circumcised without rash, lesions and discharge at  meatus. Testicles descended bilaterally,nt, no masses or edema. Musculoskeletal:     Cervical back: Neck supple. No tenderness.  Skin:    General: Skin is warm and dry.     Findings: No bruising, erythema, lesion or rash.  Neurological:     Mental Status: He is alert and oriented to person, place, and time.  Psychiatric:        Mood and Affect: Mood normal.        Behavior: Behavior normal.        Thought Content: Thought content normal.        Judgment: Judgment normal.       Assessment and Plan:  Jared Phillips is a 40 y.o. male presenting to the [facility] Department for STI screening  1. Screening for STD (sexually transmitted disease) Patient into clinic without symptoms. Patient declines blood work today.  Rec condoms with all sex. Await test results.  Counseled that RN will call if needs to RTC for treatment once results are back. - Gram stain - Gonococcus culture     No follow-ups on file.  No future appointments.  Matt Holmes, PA

## 2020-08-30 LAB — GONOCOCCUS CULTURE

## 2020-11-05 ENCOUNTER — Other Ambulatory Visit: Payer: Self-pay

## 2020-11-05 ENCOUNTER — Ambulatory Visit: Payer: Self-pay | Admitting: Physician Assistant

## 2020-11-05 DIAGNOSIS — Z113 Encounter for screening for infections with a predominantly sexual mode of transmission: Secondary | ICD-10-CM

## 2020-11-05 LAB — GRAM STAIN

## 2020-11-05 NOTE — Progress Notes (Signed)
Pt here for STD screening.  Gram stain results reviewed, no treatment required.  Drew Lips M Lemmie Steinhaus, RN ? ?

## 2020-11-07 ENCOUNTER — Encounter: Payer: Self-pay | Admitting: Physician Assistant

## 2020-11-07 NOTE — Progress Notes (Signed)
   [facility] Department STI clinic/screening visit  Subjective:  Jared Phillips is a 40 y.o. male being seen today for an STI screening visit. The patient reports they do not have symptoms.    Patient has the following medical conditions:  There are no problems to display for this patient.    Chief Complaint  Patient presents with   SEXUALLY TRANSMITTED DISEASE    Screening    HPI  Patient reports that he is not having any symptoms but would like a screening today. Denies chronic conditions, surgeries and regular medicines.  Reports last HIV test was last year and last void prior to sample collection for Gram stain was 1.5 hr ago.    See flowsheet for further details and programmatic requirements.    The following portions of the patient's history were reviewed and updated as appropriate: allergies, current medications, past medical history, past social history, past surgical history and problem list.  Objective:  There were no vitals filed for this visit.  Physical Exam Constitutional:      General: He is not in acute distress.    Appearance: Normal appearance.  HENT:     Head: Normocephalic and atraumatic.     Comments: No nits,lice, or hair loss. No cervical, supraclavicular or axillary adenopathy.     Mouth/Throat:     Mouth: Mucous membranes are moist.     Pharynx: Oropharynx is clear. No oropharyngeal exudate or posterior oropharyngeal erythema.  Eyes:     Conjunctiva/sclera: Conjunctivae normal.  Pulmonary:     Effort: Pulmonary effort is normal.  Abdominal:     Palpations: Abdomen is soft. There is no mass.     Tenderness: There is no abdominal tenderness. There is no guarding or rebound.  Genitourinary:    Penis: Normal.      Testes: Normal.     Comments: Pubic area without nits, lice, hair loss, edema, erythema, lesions and inguinal adenopathy. Penis circumcised without rash, lesions and discharge at meatus. Testicles descended  bilaterally,nt, no masses or edema.  Musculoskeletal:     Cervical back: Neck supple. No tenderness.  Skin:    General: Skin is warm and dry.     Findings: No bruising, erythema, lesion or rash.  Neurological:     Mental Status: He is alert and oriented to person, place, and time.  Psychiatric:        Mood and Affect: Mood normal.        Behavior: Behavior normal.        Thought Content: Thought content normal.        Judgment: Judgment normal.      Assessment and Plan:  Jared Phillips is a 40 y.o. male presenting to the [facility] Department for STI screening  1. Screening for STD (sexually transmitted disease) Patient into clinic without symptoms. Patient declines blood work today. Reviewed Gram stain results. Rec condoms with all sex. Await test results.  Counseled that RN will call if needs to RTC for treatment once results are back.  - Gram stain - Gonococcus culture     No follow-ups on file.  No future appointments.  Matt Holmes, PA

## 2020-11-10 LAB — GONOCOCCUS CULTURE

## 2020-12-16 ENCOUNTER — Ambulatory Visit: Payer: Self-pay | Admitting: Family Medicine

## 2020-12-16 ENCOUNTER — Encounter: Payer: Self-pay | Admitting: Family Medicine

## 2020-12-16 ENCOUNTER — Other Ambulatory Visit: Payer: Self-pay

## 2020-12-16 DIAGNOSIS — N341 Nonspecific urethritis: Secondary | ICD-10-CM

## 2020-12-16 DIAGNOSIS — Z113 Encounter for screening for infections with a predominantly sexual mode of transmission: Secondary | ICD-10-CM

## 2020-12-16 LAB — HM HIV SCREENING LAB: HM HIV Screening: NEGATIVE

## 2020-12-16 LAB — GRAM STAIN

## 2020-12-16 MED ORDER — DOXYCYCLINE HYCLATE 100 MG PO TABS: 100.0000 mg | ORAL_TABLET | Freq: Two times a day (BID) | ORAL | 0 refills | Status: AC

## 2020-12-16 NOTE — Addendum Note (Signed)
Addended by: Berdie Ogren on: 12/16/2020 09:36 AM   Modules accepted: Orders

## 2020-12-16 NOTE — Progress Notes (Signed)
Pt here for STD screening.  Gram stain results reviewed and medication dispensed per Provider orders.  Condoms given. Berdie Ogren, RN

## 2020-12-16 NOTE — Progress Notes (Signed)
[facility] Department STI clinic/screening visit  Subjective:  Jared Phillips is a 40 y.o. male being seen today for an STI screening visit. The patient reports they do not have symptoms.    Patient has the following medical conditions:  There are no problems to display for this patient.    Chief Complaint  Patient presents with   SEXUALLY TRANSMITTED DISEASE    Screening    HPI  Patient reports here for screening, denies any s/sx, "but I heard something about a partner".  Does the patient or their partner desires a pregnancy in the next year? No  Screening for MPX risk: Does the patient have an unexplained rash? No Is the patient MSM? No Does the patient endorse multiple sex partners or anonymous sex partners? Yes Did the patient have close or sexual contact with a person diagnosed with MPX? No Has the patient traveled outside the [place] where MPX is endemic? No Is there a high clinical suspicion for MPX-- evidenced by one of the following No  -Unlikely to be chickenpox  -Lymphadenopathy  -Rash that present in same phase of evolution on any given body part   See flowsheet for further details and programmatic requirements.    The following portions of the patient's history were reviewed and updated as appropriate: allergies, current medications, past medical history, past social history, past surgical history and problem list.  Objective:  There were no vitals filed for this visit.  Physical Exam Constitutional:      Appearance: Normal appearance.  HENT:     Head: Normocephalic.     Mouth/Throat:     Mouth: Mucous membranes are moist.     Pharynx: Oropharynx is clear. No oropharyngeal exudate.  Pulmonary:     Effort: Pulmonary effort is normal.  Genitourinary:    Penis: Normal.      Testes: Normal.     Comments: No lice, nits, or pest, no lesions or odor discharge.  Denies pain or tenderness with paplation of testicles.  No lesions, ulcers or  masses present.    Musculoskeletal:     Cervical back: Normal range of motion and neck supple.  Lymphadenopathy:     Cervical: No cervical adenopathy.  Skin:    General: Skin is warm and dry.     Findings: No bruising, erythema, lesion or rash.  Neurological:     Mental Status: He is alert and oriented to person, place, and time.  Psychiatric:        Mood and Affect: Mood normal.        Behavior: Behavior normal.      Assessment and Plan:  Jared Phillips is a 40 y.o. male presenting to the [facility] Department for STI screening  1. Screening examination for venereal disease   Patient does not have STI symptoms Patient accepted all screenings including  gram stain,  oral, urethral  GC and bloodwork for HIV/RPR.  Patient meets criteria for HepB screening? Yes. Ordered? Declined  Patient meets criteria for HepC screening? Yes. Ordered? No - declined Recommended condom use with all sex Discussed importance of condom use for STI prevent  Treat gram stain per standing order  Discussed time line for State Lab results and that patient will be called with positive results and encouraged patient to call if he had not heard in 2 weeks Recommended returning for continued or worsening symptoms.  - HIV [facility] - Syphilis Serology, [facility] - Gonococcus  culture - Gram stain - Gonococcus culture     Return for as needed.  No future appointments.  Wendi Snipes, FNP

## 2020-12-21 LAB — GONOCOCCUS CULTURE

## 2020-12-29 ENCOUNTER — Ambulatory Visit
Admission: RE | Admit: 2020-12-29 | Discharge: 2020-12-29 | Disposition: A | Discharge status: Home / Self Care | Payer: Self-pay | Source: Ambulatory Visit | Attending: Emergency Medicine | Admitting: Emergency Medicine

## 2020-12-29 ENCOUNTER — Other Ambulatory Visit: Payer: Self-pay

## 2020-12-29 VITALS — BP 110/68 | HR 65 | Temp 98.2°F | Resp 18

## 2020-12-29 DIAGNOSIS — Z113 Encounter for screening for infections with a predominantly sexual mode of transmission: Secondary | ICD-10-CM | POA: Insufficient documentation

## 2020-12-29 NOTE — ED Provider Notes (Signed)
Jared Phillips    CSN: [id number] Arrival date & time: 12/29/20  1445      History   Chief Complaint Chief Complaint  Patient presents with   Exposure to STD    HPI Jared Phillips is a 40 y.o. male.  Patient presents with request for STD testing.  He denies symptoms.  He denies dysuria, hematuria, abdominal pain, penile discharge, testicular pain, rash, lesions, or other symptoms.  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  The history is provided by the patient and medical records.   Past Medical History:  Diagnosis Date   Heart murmur     There are no problems to display for this patient.   Past Surgical History:  Procedure Laterality Date   NO PAST SURGERIES         Home Medications    Prior to Admission medications   Not on File    Family History Family History  Problem Relation Age of Onset   Healthy Father     Social History Social History   Tobacco Use   Smoking status: Every Day    Packs/day: 1.00    Years: 16.00    Pack years: 16.00    Types: Cigarettes   Smokeless tobacco: Never  Vaping Use   Vaping Use: Never used  Substance Use Topics   Alcohol use: Yes    Comment: occasinally has 1-3 shots of liquor   Drug use: Yes    Frequency: 7.0 times per week    Types: Marijuana     Allergies   Patient has no known allergies.   Review of Systems Review of Systems  Constitutional:  Negative for chills and fever.  Respiratory:  Negative for cough and shortness of breath.   Cardiovascular:  Negative for chest pain and palpitations.  Gastrointestinal:  Negative for abdominal pain and vomiting.  Genitourinary:  Negative for dysuria, flank pain, hematuria, penile discharge and testicular pain.  Skin:  Negative for color change and rash.  All other systems reviewed and are negative.   Physical Exam Triage Vital Signs ED Triage Vitals  Enc Vitals Group     BP 12/29/20 1458 110/68     Pulse Rate 12/29/20 1458 65      Resp 12/29/20 1458 18     Temp 12/29/20 1458 98.2 F (36.8 C)     Temp Source 12/29/20 1458 Oral     SpO2 12/29/20 1458 98 %     Weight --      Height --      Head Circumference --      Peak Flow --      Pain Score 12/29/20 1504 0     Pain Loc --      Pain Edu? --      Excl. in GC? --    No data found.  Updated Vital Signs BP 110/68 (BP Location: Left Arm)   Pulse 65   Temp 98.2 F (36.8 C) (Oral)   Resp 18   SpO2 98%   Visual Acuity Right Eye Distance:   Left Eye Distance:   Bilateral Distance:    Right Eye Near:   Left Eye Near:    Bilateral Near:     Physical Exam Vitals and nursing note reviewed.  Constitutional:      General: He is not in acute distress.    Appearance: He is well-developed. He is not ill-appearing.  HENT:     Head: Normocephalic and atraumatic.  Mouth/Throat:     Mouth: Mucous membranes are moist.  Eyes:     Conjunctiva/sclera: Conjunctivae normal.  Cardiovascular:     Rate and Rhythm: Normal rate and regular rhythm.     Heart sounds: Normal heart sounds.  Pulmonary:     Effort: Pulmonary effort is normal. No respiratory distress.     Breath sounds: Normal breath sounds.  Abdominal:     Palpations: Abdomen is soft.     Tenderness: There is no abdominal tenderness. There is no right CVA tenderness, left CVA tenderness, guarding or rebound.  Musculoskeletal:     Cervical back: Neck supple.  Skin:    General: Skin is warm and dry.  Neurological:     General: No focal deficit present.     Mental Status: He is alert and oriented to person, place, and time.     Gait: Gait normal.  Psychiatric:        Mood and Affect: Mood normal.        Behavior: Behavior normal.     UC Treatments / Results  Labs (all labs ordered are listed, but only abnormal results are displayed) Labs Reviewed  CYTOLOGY, (ORAL, ANAL, URETHRAL) ANCILLARY ONLY    EKG   Radiology No results found.  Procedures Procedures (including critical care  time)  Medications Ordered in UC Medications - No data to display  Initial Impression / Assessment and Plan / UC Course  I have reviewed the triage vital signs and the nursing notes.  Pertinent labs & imaging results that were available during my care of the patient were reviewed by me and considered in my medical decision making (see chart for details).  Screening for STDs.  Patient obtained urethral self swab for testing.  Discussed that we will call him if the test results are positive.  Instructed him to abstain from sexual activity until the test results are back.  He agrees to plan of care.   Final Clinical Impressions(s) / UC Diagnoses   Final diagnoses:  Screening for STD (sexually transmitted disease)     Discharge Instructions      Your STD tests are pending.  If your test results are positive, we will call you.  You and your sexual partner(s) may require treatment at that time.  Do not have sexual activity for at least 7 days.         ED Prescriptions   None    PDMP not reviewed this encounter.   Mickie Bail, NP 12/29/20 [phone]

## 2020-12-29 NOTE — ED Triage Notes (Signed)
Pt here to be tested for gonorrhea and chlamydia. No sx

## 2020-12-29 NOTE — Discharge Instructions (Addendum)
Your STD tests are pending.  If your test results are positive, we will call you.  You and your sexual partner(s) may require treatment at that time.  Do not have sexual activity for at least 7 days.     

## 2020-12-30 LAB — CYTOLOGY, (ORAL, ANAL, URETHRAL) ANCILLARY ONLY
Chlamydia: NEGATIVE
Comment: NEGATIVE
Comment: NEGATIVE
Comment: NORMAL
Neisseria Gonorrhea: NEGATIVE
Trichomonas: NEGATIVE

## 2021-03-26 ENCOUNTER — Encounter: Payer: Self-pay | Admitting: Family Medicine

## 2021-03-26 ENCOUNTER — Ambulatory Visit: Payer: Self-pay | Admitting: Family Medicine

## 2021-03-26 ENCOUNTER — Other Ambulatory Visit: Payer: Self-pay

## 2021-03-26 DIAGNOSIS — Z113 Encounter for screening for infections with a predominantly sexual mode of transmission: Secondary | ICD-10-CM

## 2021-03-26 DIAGNOSIS — N341 Nonspecific urethritis: Secondary | ICD-10-CM

## 2021-03-26 LAB — GRAM STAIN

## 2021-03-26 MED ORDER — DOXYCYCLINE HYCLATE 100 MG PO TABS: 100.0000 mg | ORAL_TABLET | Freq: Two times a day (BID) | ORAL | 0 refills | Status: AC

## 2021-03-26 NOTE — Addendum Note (Signed)
Addended by: Wendi Snipes on: 03/26/2021 03:26 PM   Modules accepted: Orders

## 2021-03-26 NOTE — Progress Notes (Signed)
[facility] Department STI clinic/screening visit  Subjective:  Jared Phillips is a 40 y.o. male being seen today for an STI screening visit. The patient reports they do not have symptoms.    Patient has the following medical conditions:  There are no problems to display for this patient.    Chief Complaint  Patient presents with   SEXUALLY TRANSMITTED DISEASE    Screening    HPI  Patient reports here for screening.     Does the patient or their partner desires a pregnancy in the next year? No  Screening for MPX risk: Does the patient have an unexplained rash? No Is the patient MSM? No Does the patient endorse multiple sex partners or anonymous sex partners? Yes Did the patient have close or sexual contact with a person diagnosed with MPX? No Has the patient traveled outside the [place] where MPX is endemic? No Is there a high clinical suspicion for MPX-- evidenced by one of the following No  -Unlikely to be chickenpox  -Lymphadenopathy  -Rash that present in same phase of evolution on any given body part   See flowsheet for further details and programmatic requirements.    The following portions of the patient's history were reviewed and updated as appropriate: allergies, current medications, past medical history, past social history, past surgical history and problem list.  Objective:  There were no vitals filed for this visit.  Physical Exam Constitutional:      Appearance: Normal appearance.  HENT:     Head: Normocephalic.     Mouth/Throat:     Mouth: Mucous membranes are moist.     Pharynx: Oropharynx is clear. No oropharyngeal exudate.  Pulmonary:     Effort: Pulmonary effort is normal.  Genitourinary:    Penis: Normal.      Testes: Normal.     Comments: No lice, nits, or pest, no lesions or odor discharge.  Denies pain or tenderness with paplation of testicles.  No lesions, ulcers or masses present.    Musculoskeletal:     Cervical back: Normal  range of motion and neck supple.  Lymphadenopathy:     Cervical: No cervical adenopathy.  Skin:    General: Skin is warm and dry.     Findings: No bruising, erythema, lesion or rash.  Neurological:     Mental Status: He is alert.  Psychiatric:        Mood and Affect: Mood normal.        Behavior: Behavior normal.      Assessment and Plan:  Jared Phillips is a 40 y.o. male presenting to the [facility] Department for STI screening  1. Screening examination for venereal disease Patient does not have STI symptoms Patient accepted all screenings including  gram stain  oral, urine, rectal CT/GC and bloodwork for HIV/RPR.  Patient meets criteria for HepB screening? Yes. Ordered? No - does not meet criteria  Patient meets criteria for HepC screening? Yes. Ordered? No - does not meet criteria  Recommended condom use with all sex Discussed importance of condom use for STI prevent  Treat gram stain per standing order Discussed time line for State Lab results and that patient will be called with positive results and encouraged patient to call if he had not heard in 2 weeks Recommended returning for continued or worsening symptoms.  - Gonococcus culture - Gram stain - HIV  LAB - Syphilis Serology,  Lab - Gonococcus culture  Return for as needed.  No future appointments.  Wendi Snipes, FNP

## 2021-03-30 LAB — GONOCOCCUS CULTURE

## 2021-05-23 IMAGING — CR DG SHOULDER 2+V*R*
3 series · 3 of 3 positions shown · non-contrast
Comparison: None.

CLINICAL DATA: Posttraumatic right shoulder pain

EXAM:
RIGHT SHOULDER - 2+ VIEW

[shoulder grashey]
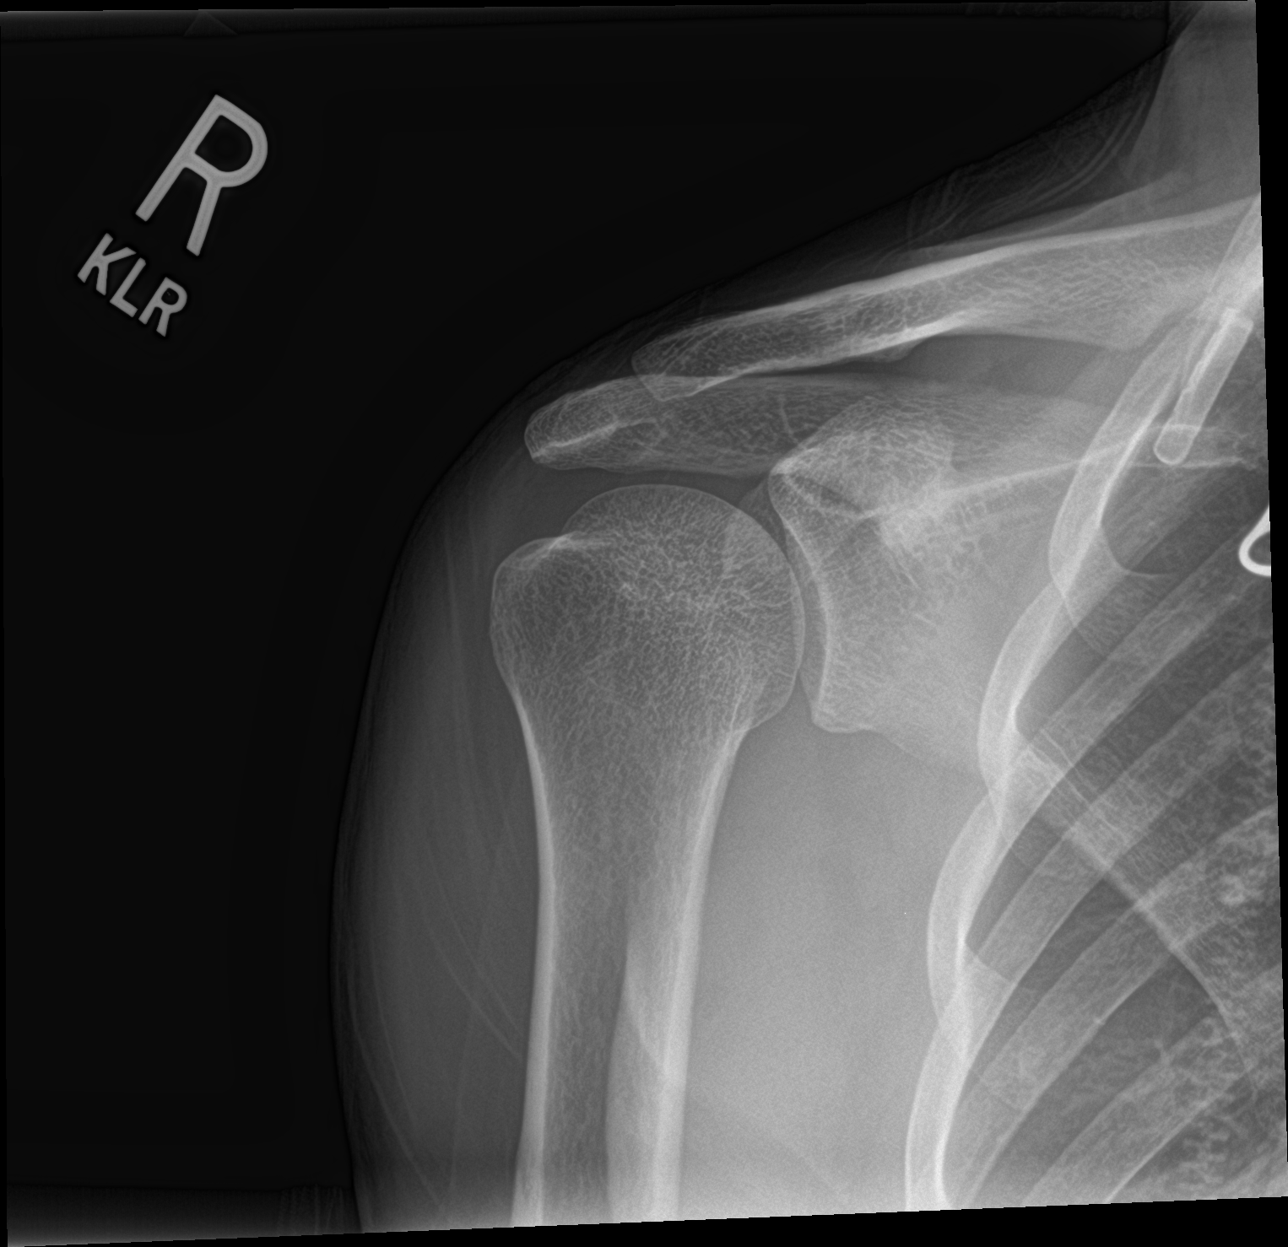

[shoulder y view]
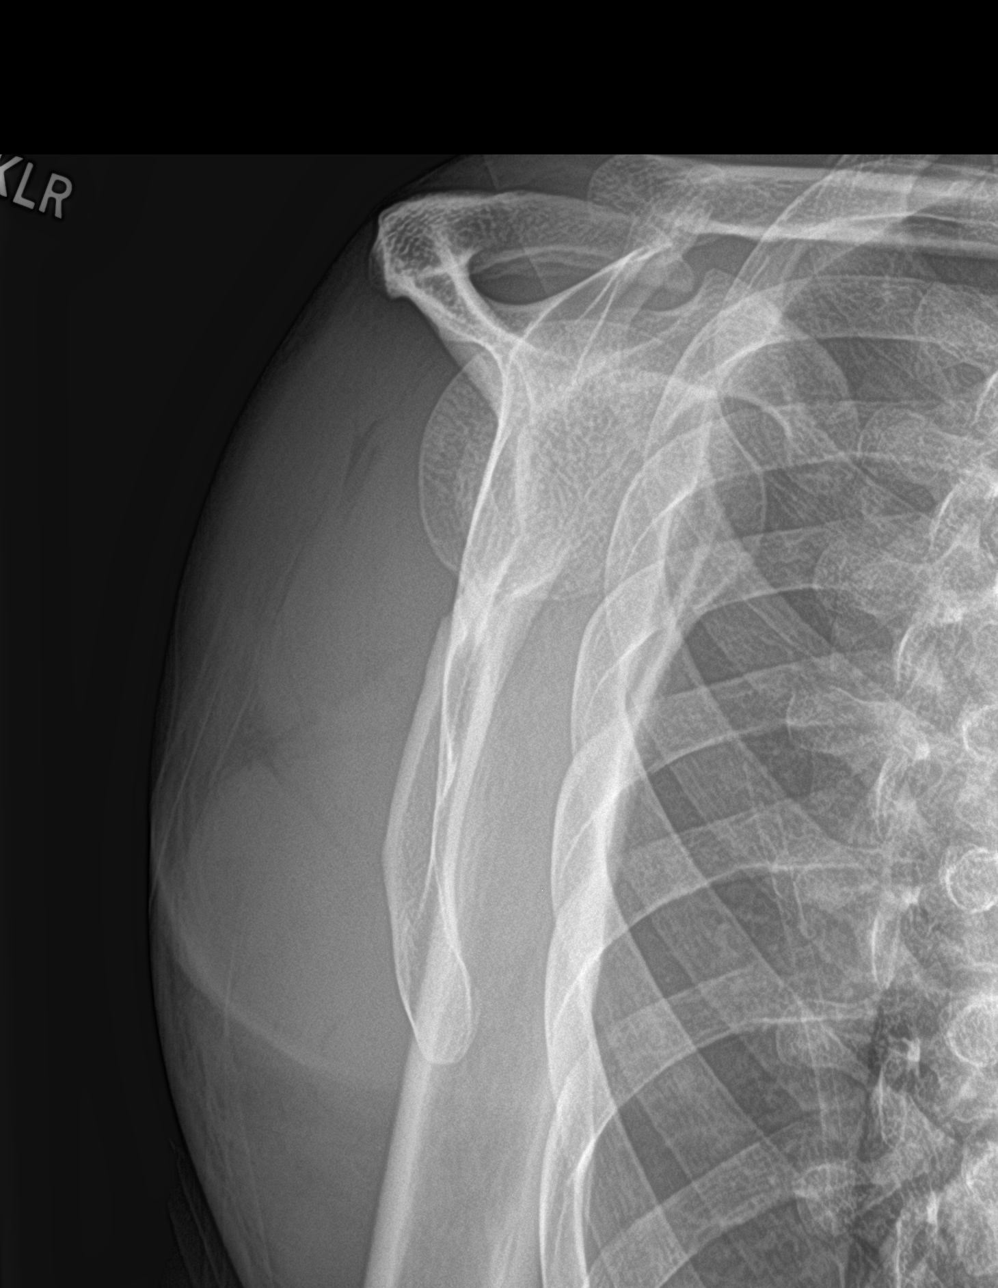

[shoulder axillary]
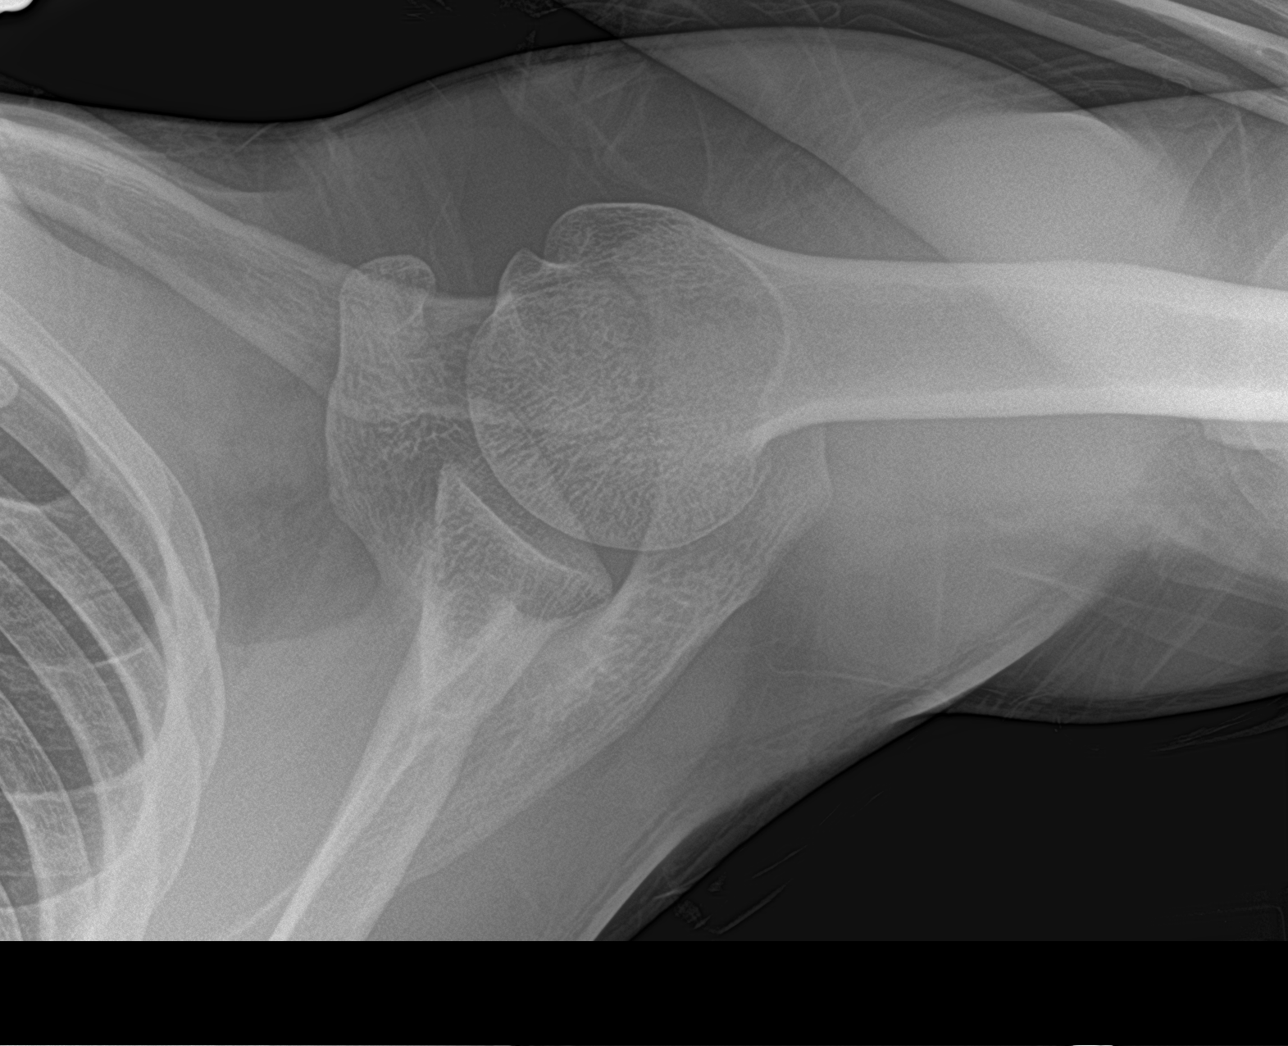

[3 of 3 positions shown; findings below may reference images not displayed]

FINDINGS: There is no evidence of fracture or dislocation. There is no
evidence of arthropathy or other focal bone abnormality. Soft
tissues are unremarkable.
IMPRESSION: Negative.

## 2021-06-01 ENCOUNTER — Ambulatory Visit: Payer: Self-pay

## 2021-06-04 ENCOUNTER — Encounter: Payer: Self-pay | Admitting: Nurse Practitioner

## 2021-06-04 ENCOUNTER — Other Ambulatory Visit: Payer: Self-pay

## 2021-06-04 ENCOUNTER — Ambulatory Visit: Payer: Self-pay | Admitting: Nurse Practitioner

## 2021-06-04 DIAGNOSIS — Z113 Encounter for screening for infections with a predominantly sexual mode of transmission: Secondary | ICD-10-CM

## 2021-06-04 LAB — HM HEPATITIS C SCREENING LAB: HM Hepatitis Screen: NEGATIVE

## 2021-06-04 LAB — GRAM STAIN

## 2021-06-04 LAB — HM HIV SCREENING LAB: HM HIV Screening: NEGATIVE

## 2021-06-04 LAB — HEPATITIS B SURFACE ANTIGEN: Hepatitis B Surface Ag: NONREACTIVE

## 2021-06-04 NOTE — Progress Notes (Signed)
Patient for STD testing. Gram stain reviewed.No tx per standing orders, Condoms given.  ?

## 2021-06-04 NOTE — Progress Notes (Signed)
[facility] Department ?STI clinic/screening visit ? ?Subjective:  ?Jared Phillips is a 41 y.o. male being seen today for an STI screening visit. The patient reports they do not have symptoms.   ? ?Patient has the following medical conditions:  There are no problems to display for this patient. ? ? ? ?Chief Complaint  ?Patient presents with  ? SEXUALLY TRANSMITTED DISEASE  ?  screening  ? ? ?HPI ? ?Patient reports to clinic today for STD screening.  Denies signs and symptoms.   ? ?Does the patient or their partner desires a pregnancy in the next year? No ? ?Screening for MPX risk: ?Does the patient have an unexplained rash? No ?Is the patient MSM? No ?Does the patient endorse multiple sex partners or anonymous sex partners? No ?Did the patient have close or sexual contact with a person diagnosed with MPX? No ?Has the patient traveled outside the [place] where MPX is endemic? No ?Is there a high clinical suspicion for MPX-- evidenced by one of the following No ? -Unlikely to be chickenpox ? -Lymphadenopathy ? -Rash that present in same phase of evolution on any given body part ? ? ?See flowsheet for further details and programmatic requirements.  ? ? ?The following portions of the patient's history were reviewed and updated as appropriate: allergies, current medications, past medical history, past social history, past surgical history and problem list. ? ?Objective:  ?There were no vitals filed for this visit. ? ?Physical Exam ?Constitutional:   ?   Appearance: Normal appearance.  ?HENT:  ?   Head: Normocephalic.  ?   Right Ear: External ear normal.  ?   Left Ear: External ear normal.  ?   Nose: Nose normal.  ?   Mouth/Throat:  ?   Mouth: Mucous membranes are moist.  ?   Comments: No visible signs of dental caries.   ?Pulmonary:  ?   Effort: Pulmonary effort is normal.  ?Abdominal:  ?   General: Abdomen is flat.  ?   Palpations: Abdomen is soft.  ?Genitourinary: ?   Penis: Circumcised.   ?   Comments: Pubic  area without nits, lice, hair loss, edema, erythema, lesions and inguinal adenopathy. ?Penis without rash, lesions and discharge at meatus. ?Testicles descended bilaterally,nt, no masses or edema.  ?Musculoskeletal:  ?   Cervical back: Full passive range of motion without pain, normal range of motion and neck supple.  ?Skin: ?   General: Skin is warm and dry.  ?Neurological:  ?   Mental Status: He is alert and oriented to person, place, and time.  ?Psychiatric:     ?   Attention and Perception: Attention normal.     ?   Mood and Affect: Mood normal.     ?   Speech: Speech normal.     ?   Behavior: Behavior is cooperative.  ? ? ? ? ?Assessment and Plan:  ?Jared Phillips is a 41 y.o. male presenting to the [facility] Department for STI screening ? ?1. Screening examination for venereal disease ?-41 year old male in clinic today for STD screening. ?-Patient does not have STI symptoms ?Patient accepted all screenings including  oral GC and bloodwork for HIV/RPR, HBV/HCV. ?Patient meets criteria for HepB screening? Yes. Ordered? Yes ?Patient meets criteria for HepC screening? Yes. Ordered? Yes ?Recommended condom use with all sex ?Discussed importance of condom use for STI prevent ? ?Treat gram stain per standing order ? ?Discussed time line for  State Lab results and that patient will be called with positive results and encouraged patient to call if he had not heard in 2 weeks ?Recommended returning for continued or worsening symptoms.   ?- Gonococcus culture ?- Gonococcus culture ?- Gram stain ?- HIV/HCV [facility] ?- Syphilis Serology, [facility] ?- HBV Antigen/Antibody [facility] ? ? ? ? ?Return if symptoms worsen or fail to improve. ? ? ? ?Gregary Cromer, FNP ?

## 2021-06-09 LAB — GONOCOCCUS CULTURE

## 2021-07-28 ENCOUNTER — Ambulatory Visit: Payer: Self-pay

## 2021-08-05 ENCOUNTER — Encounter: Payer: Self-pay | Admitting: Nurse Practitioner

## 2021-08-05 ENCOUNTER — Ambulatory Visit: Payer: Self-pay | Admitting: Nurse Practitioner

## 2021-08-05 DIAGNOSIS — Z113 Encounter for screening for infections with a predominantly sexual mode of transmission: Secondary | ICD-10-CM

## 2021-08-05 NOTE — Progress Notes (Signed)
Pt here for STD screening.  Condoms given.  Jossette Zirbel M Konica Stankowski, RN  

## 2021-08-05 NOTE — Progress Notes (Signed)
[facility] Department ?STI clinic/screening visit ? ?Subjective:  ?Jared Phillips is a 41 y.o. male being seen today for an STI screening visit. The patient reports they do not have symptoms.   ? ?Patient has the following medical conditions:  There are no problems to display for this patient. ? ? ? ?Chief Complaint  ?Patient presents with  ? SEXUALLY TRANSMITTED DISEASE  ?  Screening  ? ? ?HPI ? ?Patient reports to clinic today for STD screening.  Patient is asymptomatic.   ? ?Does the patient or their partner desires a pregnancy in the next year? No ? ?Screening for MPX risk: ?Does the patient have an unexplained rash? No ?Is the patient MSM? No ?Does the patient endorse multiple sex partners or anonymous sex partners? No ?Did the patient have close or sexual contact with a person diagnosed with MPX? No ?Has the patient traveled outside the [place] where MPX is endemic? No ?Is there a high clinical suspicion for MPX-- evidenced by one of the following No ? -Unlikely to be chickenpox ? -Lymphadenopathy ? -Rash that present in same phase of evolution on any given body part ? ? ?See flowsheet for further details and programmatic requirements.  ? ? ?There is no immunization history on file for this patient.  ? ?The following portions of the patient's history were reviewed and updated as appropriate: allergies, current medications, past medical history, past social history, past surgical history and problem list. ? ?Objective:  ?There were no vitals filed for this visit. ? ?Physical Exam ?Constitutional:   ?   Appearance: Normal appearance.  ?HENT:  ?   Head: Normocephalic.  ?   Right Ear: External ear normal.  ?   Left Ear: External ear normal.  ?   Nose: Nose normal.  ?   Mouth/Throat:  ?   Lips: Pink.  ?   Mouth: Mucous membranes are moist.  ?   Comments: No visible signs of dental caries  ?Pulmonary:  ?   Effort: Pulmonary effort is normal.  ?Abdominal:  ?   General: Abdomen is flat.  ?   Palpations:  Abdomen is soft.  ?Genitourinary: ?   Penis: Circumcised.   ?   Comments: Pubic area without nits, lice, hair loss, edema, erythema, lesions and inguinal adenopathy. ?Penis without rash, lesions and discharge at meatus. ?Testicles descended bilaterally,nt, no masses or edema.  ?Musculoskeletal:  ?   Cervical back: Full passive range of motion without pain, normal range of motion and neck supple.  ?Skin: ?   General: Skin is warm and dry.  ?Neurological:  ?   Mental Status: He is alert and oriented to person, place, and time.  ?Psychiatric:     ?   Attention and Perception: Attention normal.     ?   Mood and Affect: Mood normal.     ?   Speech: Speech normal.     ?   Behavior: Behavior normal. Behavior is cooperative.  ? ? ? ? ?Assessment and Plan:  ?Jared Phillips is a 41 y.o. male presenting to the [facility] Department for STI screening ? ?1. Screening examination for venereal disease ?-41 year old male in clinic today for STD screening. ?-Patient does not have STI symptoms ?Patient accepted all screenings including  urine CT/GC, throat GC and declines bloodwork for HIV/RPR.  ?Patient meets criteria for HepB screening? Yes. Ordered? No, refused ?Patient meets criteria for HepC screening? Yes. Ordered? No - refused ?Recommended condom  use with all sex ?Discussed importance of condom use for STI prevent ? ? ?Discussed time line for State Lab results and that patient will be called with positive results and encouraged patient to call if he had not heard in 2 weeks ?Recommended returning for continued or worsening symptoms.    ? ?- Chlamydia/Gonorrhea [facility] ?- Gonococcus culture ? ? ? ? ?Return if symptoms worsen or fail to improve. ? ? ? ?Glenna Fellows, FNP ?

## 2021-08-09 LAB — GONOCOCCUS CULTURE

## 2021-08-16 ENCOUNTER — Telehealth: Payer: Self-pay | Admitting: Family Medicine

## 2021-08-16 NOTE — Telephone Encounter (Signed)
No additional notes

## 2021-11-24 ENCOUNTER — Ambulatory Visit: Payer: Self-pay | Admitting: Nurse Practitioner

## 2021-11-24 DIAGNOSIS — Z113 Encounter for screening for infections with a predominantly sexual mode of transmission: Secondary | ICD-10-CM

## 2021-11-24 NOTE — Progress Notes (Signed)
Patient stated he had been tested for STDs at [facility] yesterday 11/23/21. Patient declines STD symptoms. States he will await results from [facility] clinic. Provider did not seen patient.

## 2022-07-27 ENCOUNTER — Ambulatory Visit: Payer: Self-pay | Admitting: Family Medicine

## 2022-07-27 DIAGNOSIS — Z113 Encounter for screening for infections with a predominantly sexual mode of transmission: Secondary | ICD-10-CM

## 2022-07-27 LAB — HM HIV SCREENING LAB: HM HIV Screening: NEGATIVE

## 2022-07-27 LAB — HM HEPATITIS C SCREENING LAB: HM Hepatitis Screen: NEGATIVE

## 2022-07-27 LAB — HEPATITIS B SURFACE ANTIGEN: Hepatitis B Surface Ag: NONREACTIVE

## 2022-07-27 NOTE — Progress Notes (Signed)
Pt appointment for STI screening. Seen by FNP [place].  

## 2022-07-27 NOTE — Progress Notes (Signed)
[facility] Department STI clinic/screening visit  Subjective:  Jared Phillips is a 42 y.o. male being seen today for an STI screening visit. The patient reports they do not have symptoms.    Patient has the following medical conditions:  There are no problems to display for this patient.    Chief Complaint  Patient presents with   SEXUALLY TRANSMITTED DISEASE    New partner.     HPI  Patient reports to clinic for STI testing- no symptoms  Last HIV test per patient/review of record was  Lab Results  Component Value Date   HMHIVSCREEN Negative - Validated 06/04/2021   No results found for: "HIV"  Does the patient or their partner desires a pregnancy in the next year? No  Screening for MPX risk: Does the patient have an unexplained rash? No Is the patient MSM? No Does the patient endorse multiple sex partners or anonymous sex partners? No Did the patient have close or sexual contact with a person diagnosed with MPX? No Has the patient traveled outside the [place] where MPX is endemic? No Is there a high clinical suspicion for MPX-- evidenced by one of the following No  -Unlikely to be chickenpox  -Lymphadenopathy  -Rash that present in same phase of evolution on any given body part   See flowsheet for further details and programmatic requirements.    There is no immunization history on file for this patient.   The following portions of the patient's history were reviewed and updated as appropriate: allergies, current medications, past medical history, past social history, past surgical history and problem list.  Objective:  There were no vitals filed for this visit.  Physical Exam Vitals and nursing note reviewed.  Constitutional:      Appearance: Normal appearance.  HENT:     Head: Normocephalic and atraumatic.     Mouth/Throat:     Mouth: Mucous membranes are moist.     Pharynx: No oropharyngeal exudate or posterior oropharyngeal erythema.  Eyes:      General:        Right eye: No discharge.        Left eye: No discharge.     Conjunctiva/sclera:     Right eye: Right conjunctiva is not injected. No exudate.    Left eye: Left conjunctiva is not injected. No exudate. Pulmonary:     Effort: Pulmonary effort is normal.  Abdominal:     General: Abdomen is flat.     Palpations: Abdomen is soft. There is no hepatomegaly or mass.     Tenderness: There is no abdominal tenderness. There is no rebound.  Genitourinary:    Comments: Declined genital exam- asymptomatic Lymphadenopathy:     Cervical: No cervical adenopathy.     Upper Body:     Right upper body: No supraclavicular or axillary adenopathy.     Left upper body: No supraclavicular or axillary adenopathy.  Skin:    General: Skin is warm and dry.  Neurological:     Mental Status: He is alert and oriented to person, place, and time.       Assessment and Plan:  Jared Phillips is a 42 y.o. male presenting to the [facility] Department for STI screening  1. Screening for venereal disease  - HIV/HCV [facility] - HBV Antigen/Antibody [facility] - Syphilis Serology, [place] [facility] - Chlamydia/GC NAA, Confirmation   Patient does not have STI symptoms Patient accepted all screenings including  urine GC/Chlamydia, and blood work for HIV/Syphilis. Patient meets criteria for HepB screening? Yes. Ordered? yes Patient meets criteria for HepC screening? Yes. Ordered? yes Recommended condom use with all sex Discussed importance of condom use for STI prevent  Treat positive test results per standing order. Discussed time line for [facility] results and that patient will be called with positive results and encouraged patient to call if he had not heard in 2 weeks Recommended repeat testing in 3 months with positive results. Recommended returning for continued or worsening symptoms.   Return if symptoms worsen or fail to improve, for STI screening.  No future  appointments. Total time spent 20 minutes  Lenice Llamas, [place]

## 2022-07-30 LAB — CHLAMYDIA/GC NAA, CONFIRMATION
Chlamydia trachomatis, NAA: NEGATIVE
Neisseria gonorrhoeae, NAA: NEGATIVE
